# Patient Record
Sex: Male | Born: 1975 | Race: White | Hispanic: No | Marital: Single | Smoking: Former smoker
Health system: Southern US, Community
[De-identification: ages and names within clinical notes are randomized; demographics above are authoritative.]

## PROBLEM LIST (undated history)

## (undated) DIAGNOSIS — E0590 Thyrotoxicosis, unspecified without thyrotoxic crisis or storm: Secondary | ICD-10-CM

## (undated) DIAGNOSIS — K219 Gastro-esophageal reflux disease without esophagitis: Secondary | ICD-10-CM

## (undated) HISTORY — PX: BACK SURGERY: SHX140

## (undated) HISTORY — PX: KNEE SURGERY: SHX244

---

## 2016-05-15 ENCOUNTER — Emergency Department
Admission: EM | Admit: 2016-05-15 | Discharge: 2016-05-15 | Disposition: A | Discharge status: Home / Self Care | Payer: Self-pay | Attending: Emergency Medicine | Admitting: Emergency Medicine

## 2016-05-15 ENCOUNTER — Emergency Department: Payer: Self-pay

## 2016-05-15 ENCOUNTER — Encounter: Payer: Self-pay | Admitting: Emergency Medicine

## 2016-05-15 DIAGNOSIS — M25552 Pain in left hip: Secondary | ICD-10-CM | POA: Insufficient documentation

## 2016-05-15 DIAGNOSIS — W51XXXD Accidental striking against or bumped into by another person, subsequent encounter: Secondary | ICD-10-CM | POA: Insufficient documentation

## 2016-05-15 DIAGNOSIS — M5410 Radiculopathy, site unspecified: Secondary | ICD-10-CM | POA: Insufficient documentation

## 2016-05-15 DIAGNOSIS — G8911 Acute pain due to trauma: Secondary | ICD-10-CM

## 2016-05-15 DIAGNOSIS — Z87891 Personal history of nicotine dependence: Secondary | ICD-10-CM | POA: Insufficient documentation

## 2016-05-15 DIAGNOSIS — M545 Low back pain: Secondary | ICD-10-CM

## 2016-05-15 HISTORY — DX: Gastro-esophageal reflux disease without esophagitis: K21.9

## 2016-05-15 MED ORDER — OXYCODONE-ACETAMINOPHEN 5-325 MG PO TABS
2.0000 | ORAL_TABLET | Freq: Once | ORAL | Status: AC
  Administered 2016-05-15: 2 via ORAL
  Filled 2016-05-15: qty 2

## 2016-05-15 MED ORDER — DIAZEPAM 2 MG PO TABS: 2.0000 mg | ORAL_TABLET | Freq: Three times a day (TID) | ORAL | 0 refills | Status: AC | PRN

## 2016-05-15 MED ORDER — DICLOFENAC SODIUM 75 MG PO TBEC: 75.0000 mg | DELAYED_RELEASE_TABLET | Freq: Two times a day (BID) | ORAL | 0 refills | Status: AC

## 2016-05-15 NOTE — ED Provider Notes (Signed)
[facility] Emergency Department Provider Note  ____________________________________________   First MD Initiated Contact with Patient 05/15/16 1722     (approximate)  I have reviewed the triage vital signs and the nursing notes.   HISTORY  Chief Complaint Back Pain    HPI Juan Holland is a 41 y.o. male is here with complaint of back pain. Patient states that 2 weeks ago he had an injury at work and which time he was bumped by a another employee causing him to fall backwards. Patient states that he initially landed on his buttocks and then onto his back. He denies any head injury or loss of consciousness. He states on that day he was seen at [facility] and they had a sprain and sciatica. Patient states that he has not had any relief with the medication that he was given. He was discharged from the urgent care on baclofen and naproxen which she states did not help with his pain. He now states that back pain continues and has radiation into his left leg. Currently he is waiting for referral to the orthopedist. Patient denies any hematuria. He denies any incontinence of bowel or bladder. Patient states that he did have back surgery in 2002 when he had a fusion. Currently he rates his pain as an 8 out of 10.   Past Medical History:  Diagnosis Date  . GERD (gastroesophageal reflux disease)     There are no active problems to display for this patient.   Past Surgical History:  Procedure Laterality Date  . BACK SURGERY    . KNEE SURGERY      Prior to Admission medications   Medication Sig Start Date End Date Taking? Authorizing Provider  diazepam (VALIUM) 2 MG tablet Take 1 tablet (2 mg total) by mouth every 8 (eight) hours as needed for muscle spasms. 05/15/16   Tommi Rumps, PA-C  diclofenac (VOLTAREN) 75 MG EC tablet Take 1 tablet (75 mg total) by mouth 2 (two) times daily. 05/15/16   Tommi Rumps, PA-C    Allergies Amoxicillin;  Medrol [methylprednisolone]; and Toradol [ketorolac tromethamine]  History reviewed. No pertinent family history.  Social History Social History  Substance Use Topics  . Smoking status: Former Smoker    Quit date: 04/24/2008  . Smokeless tobacco: Never Used  . Alcohol use Yes     Comment: sometimes    Review of Systems Constitutional: No fever/chills Eyes: No visual changes. Cardiovascular: Denies chest pain. Respiratory: Denies shortness of breath. Gastrointestinal: No nausea, no vomiting.   Genitourinary: Negative for dysuria. Musculoskeletal: Positive for back pain. Positive for left hip pain Skin: Negative for rash. Neurological: Negative for headaches, focal weakness or numbness.  10-point ROS otherwise negative.  ____________________________________________   PHYSICAL EXAM:  VITAL SIGNS: ED Triage Vitals  Enc Vitals Group     BP 05/15/16 1657 (!) 145/86     Pulse Rate 05/15/16 1657 76     Resp 05/15/16 1657 18     Temp 05/15/16 1657 97.9 F (36.6 C)     Temp Source 05/15/16 1657 Oral     SpO2 05/15/16 1657 98 %     Weight 05/15/16 1658 232 lb (105.2 kg)     Height 05/15/16 1658 6\' 2"  (1.88 m)     Head Circumference --      Peak Flow --      Pain Score 05/15/16 1708 8     Pain Loc --  Pain Edu? --      Excl. in GC? --     Constitutional: Alert and oriented. Well appearing and in no acute distress. Eyes: Conjunctivae are normal. PERRL. EOMI. Head: Atraumatic. Nose: No congestion/rhinnorhea. Neck: No stridor.   Cardiovascular: Normal rate, regular rhythm. Grossly normal heart sounds.  Good peripheral circulation. Respiratory: Normal respiratory effort.  No retractions. Lungs CTAB. Gastrointestinal: Soft and nontender. No distention.  Musculoskeletal: On examination of the back there is no gross deformity. There is well-healed surgical scar at the waistline area. There is marked tenderness on palpation of the upper lumbar spine and continues to the  sacrum. There is tenderness on palpation of the paravertebral muscles. Range of motion is restricted and guarded. Neurologic:  Normal speech and language. No gross focal neurologic deficits are appreciated. Reflexes were 2+ bilaterally.  Skin:  Skin is warm, dry and intact. No rash noted. Psychiatric: Mood and affect are normal. Speech and behavior are normal.  ____________________________________________   LABS (all labs ordered are listed, but only abnormal results are displayed)  Labs Reviewed - No data to display  RADIOLOGY  Lumbar spine x-ray per radiologist: IMPRESSION: Slightly rotated exam without fracture noted. Minimal L3-4 and L5-S1 disc space narrowing. Mild L4-5 disc space narrowing.  Left hip x-ray per radiologist is negative for bony abnormalities.  I, Tommi Rumpshonda L Benuel Ly, personally viewed and evaluated these images (plain radiographs) as part of my medical decision making, as well as reviewing the written report by the radiologist. ____________________________________________   PROCEDURES  Procedure(s) performed: None  Procedures  Critical Care performed: No  ____________________________________________   INITIAL IMPRESSION / ASSESSMENT AND PLAN / ED COURSE  Pertinent labs & imaging results that were available during my care of the patient were reviewed by me and considered in my medical decision making (see chart for details).  Patient is allergic to Toradol and Medrol. Patient states that he's been taking naproxen and baclofen without any relief of his pain. Patient was also given some type of cortisone shot while in the clinic however he is unable to say what it is that he had a shot of. Patient was given a prescription for Voltaren 75 mg twice a day with food. He is also given a prescription for diazepam 2 mg 1 at bedtime. He is to discontinue taking the naproxen and baclofen if he has any left. He is to check with his company before making an appointment  with Dr. Joice LoftsPoggi. He is encouraged to use ice or heat to his back. He is aware that he cannot take the muscle relaxant at work because it could cause drowsiness.   ____________________________________________   FINAL CLINICAL IMPRESSION(S) / ED DIAGNOSES  Final diagnoses:  Acute low back pain due to trauma  Radicular syndrome of left leg      NEW MEDICATIONS STARTED DURING THIS VISIT:  Discharge Medication List as of 05/15/2016  7:11 PM    START taking these medications   Details  diazepam (VALIUM) 2 MG tablet Take 1 tablet (2 mg total) by mouth every 8 (eight) hours as needed for muscle spasms., Starting Mon 05/15/2016, Print    diclofenac (VOLTAREN) 75 MG EC tablet Take 1 tablet (75 mg total) by mouth 2 (two) times daily., Starting Mon 05/15/2016, Print         Note:  This document was prepared using Dragon voice recognition software and may include unintentional dictation errors.    Tommi RumpsRhonda L Laramie Gelles, PA-C 05/15/16 1926    Myra Rudeavid Matthew  Schaevitz, MD 05/15/16 2224

## 2016-05-15 NOTE — ED Notes (Signed)

## 2016-05-15 NOTE — ED Triage Notes (Signed)
Pt from home with back and leg pain following a fall 2 weeks ago. He states that he went to urgent care and was told he had a sprain and sciatica. Pt state the pain is like burning, all in his back, groin, down leg. Waiting for referral to orthopedist. Pt alert & oriented.

## 2016-05-15 NOTE — Discharge Instructions (Signed)
Follow-up with Dr. Joice LoftsPoggi, you need to call and make an appointment. He is the orthopedist that is on call today. Check with your accompanying before making this appointment since this is Workmen's Comp. Take medication only as directed. Voltaren 75 mg twice a day with food. Diazepam 2 mg 1 tablet at bed time for muscle spasms. Discontinue taking naproxen and baclofen.

## 2018-04-14 IMAGING — CR DG HIP (WITH OR WITHOUT PELVIS) 2-3V*L*
1 series · 3 of 3 positions shown · non-contrast
Comparison: None.

CLINICAL DATA: 40-year-old male with a history of back pain and leg
pain after a fall

EXAM:
DG HIP (WITH OR WITHOUT PELVIS) 2-3V LEFT

[Series 1: dg hip unilat w or w/o pelvis 2-3 views  · non-contrast · 0.14mm/px · 3 of 3 slices shown]
[im 1/3]
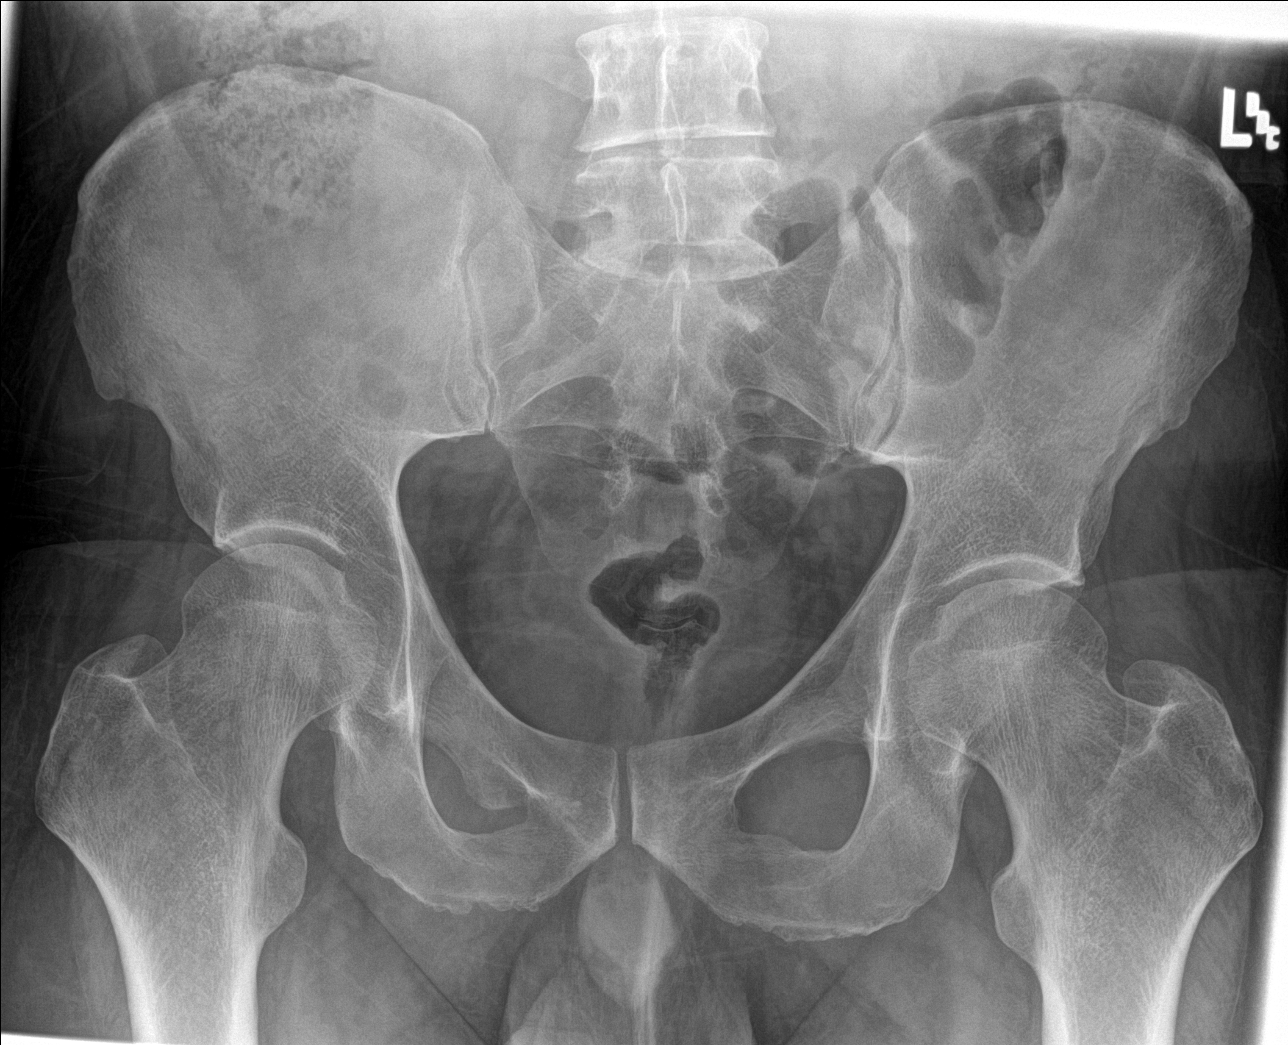
[im 2/3]
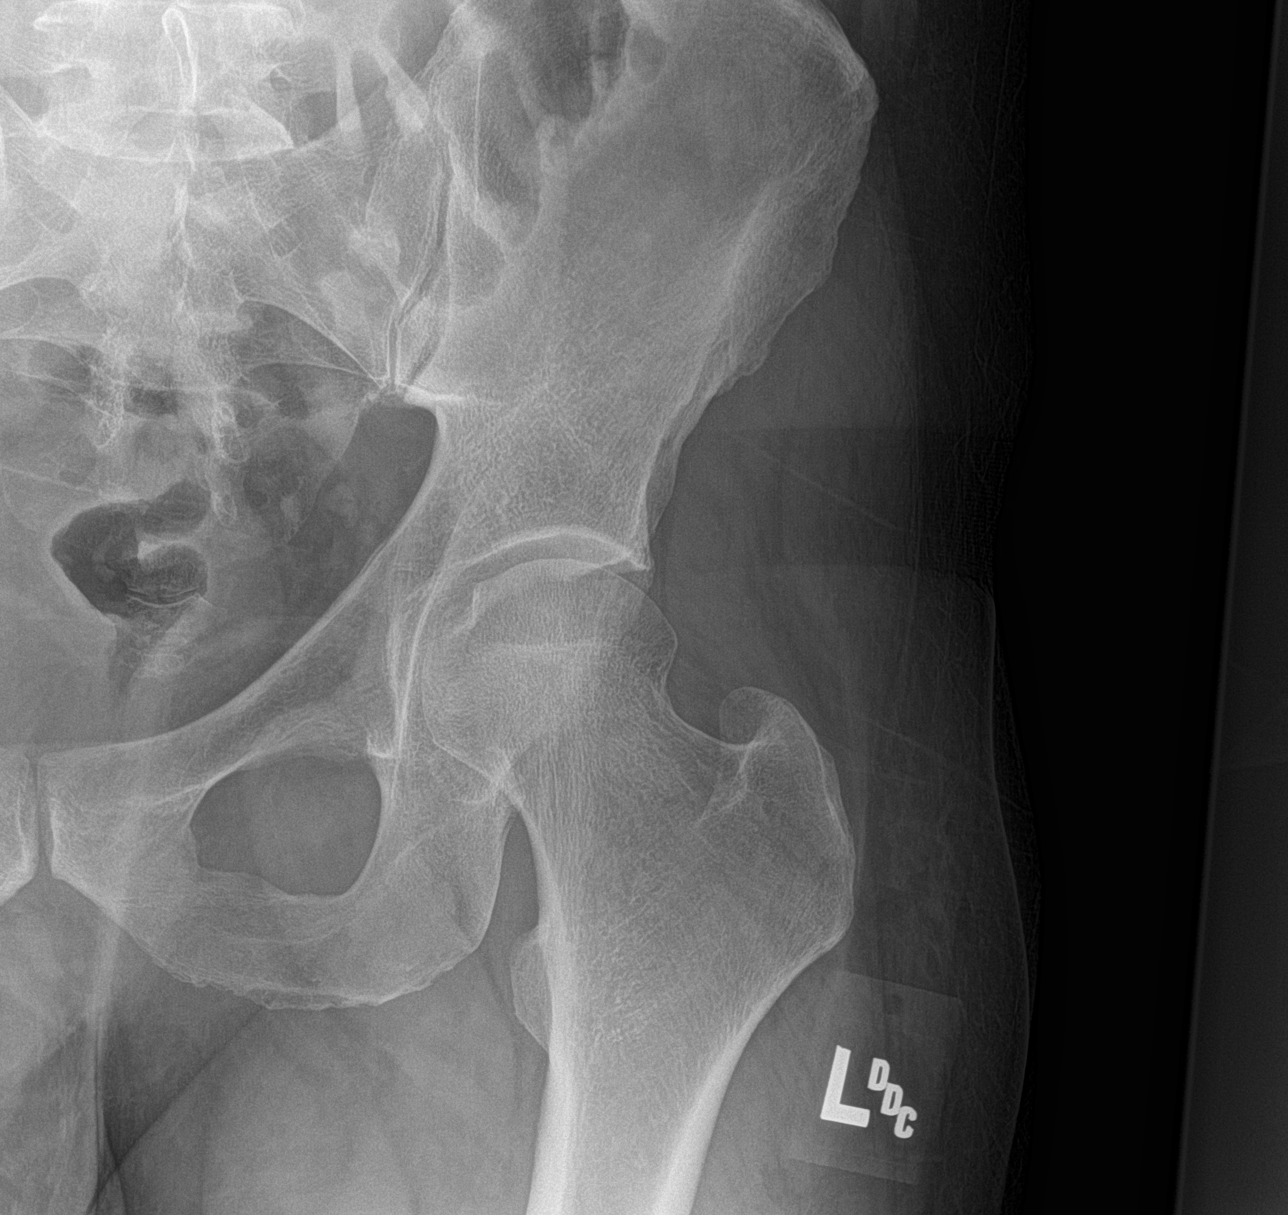
[im 3/3]
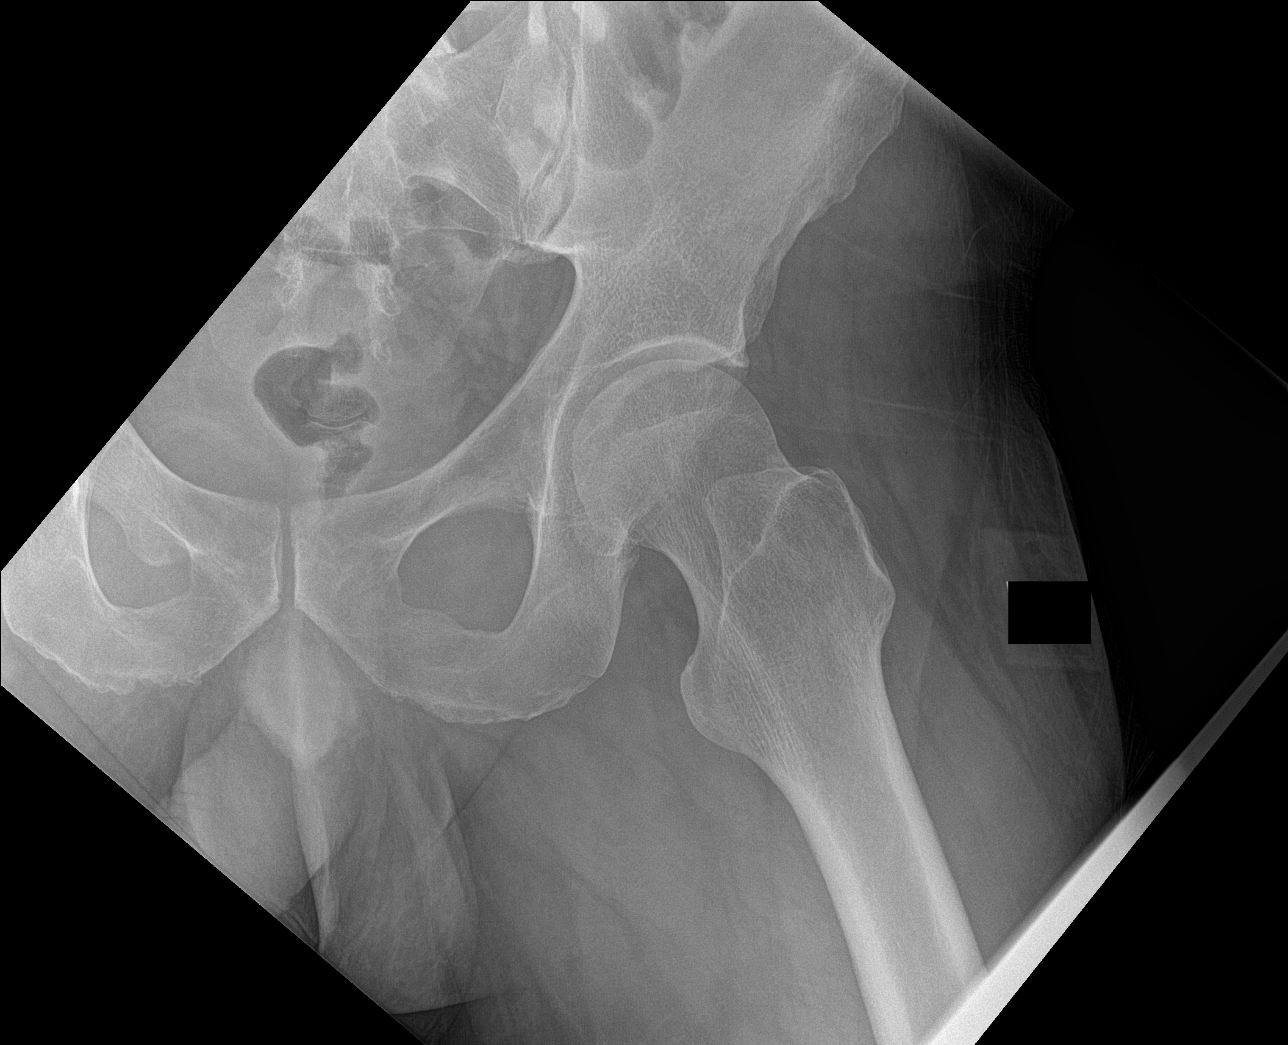

[3 of 3 positions shown; findings below may reference images not displayed]

FINDINGS: There is no evidence of hip fracture or dislocation. There is no
evidence of arthropathy or other focal bone abnormality.
IMPRESSION: Negative for acute bony abnormality.

## 2022-12-18 ENCOUNTER — Other Ambulatory Visit: Payer: Self-pay

## 2022-12-18 ENCOUNTER — Emergency Department: Payer: Medicaid Other

## 2022-12-18 ENCOUNTER — Emergency Department
Admission: EM | Admit: 2022-12-18 | Discharge: 2022-12-18 | Disposition: A | Discharge status: Home / Self Care | Payer: Medicaid Other | Attending: Emergency Medicine | Admitting: Emergency Medicine

## 2022-12-18 DIAGNOSIS — R0789 Other chest pain: Secondary | ICD-10-CM | POA: Insufficient documentation

## 2022-12-18 LAB — BASIC METABOLIC PANEL
Anion gap: 8 (ref 5–15)
BUN: 11 mg/dL (ref 6–20)
CO2: 23 mmol/L (ref 22–32)
Calcium: 8.9 mg/dL (ref 8.9–10.3)
Chloride: 106 mmol/L (ref 98–111)
Creatinine, Ser: 0.9 mg/dL (ref 0.61–1.24)
GFR, Estimated: >60 mL/min (ref >60)
Glucose, Bld: 112 mg/dL — ABNORMAL HIGH (ref 70–99)
Potassium: 3.7 mmol/L (ref 3.5–5.1)
Sodium: 137 mmol/L (ref 135–145)

## 2022-12-18 LAB — CBC
HCT: 45.2 % (ref 39.0–52.0)
Hemoglobin: 15.9 g/dL (ref 13.0–17.0)
MCH: 30.1 pg (ref 26.0–34.0)
MCHC: 35.2 g/dL (ref 30.0–36.0)
MCV: 85.4 fL (ref 80.0–100.0)
Platelets: 234 10*3/uL (ref 150–400)
RBC: 5.29 MIL/uL (ref 4.22–5.81)
RDW: 12.6 % (ref 11.5–15.5)
WBC: 9 10*3/uL (ref 4.0–10.5)
nRBC: 0 % (ref 0.0–0.2)

## 2022-12-18 LAB — TROPONIN I (HIGH SENSITIVITY): Troponin I (High Sensitivity): 6 ng/L (ref <18)

## 2022-12-18 MED ORDER — IBUPROFEN 600 MG PO TABS: 600.0000 mg | ORAL_TABLET | Freq: Four times a day (QID) | ORAL | 0 refills | Status: AC | PRN

## 2022-12-18 MED ORDER — CYCLOBENZAPRINE HCL 10 MG PO TABS
10.0000 mg | ORAL_TABLET | Freq: Once | ORAL | Status: AC
  Administered 2022-12-18: 10 mg via ORAL
  Filled 2022-12-18: qty 1

## 2022-12-18 MED ORDER — CYCLOBENZAPRINE HCL 10 MG PO TABS: 10.0000 mg | ORAL_TABLET | Freq: Three times a day (TID) | ORAL | 0 refills | Status: AC | PRN

## 2022-12-18 MED ORDER — IBUPROFEN 600 MG PO TABS
600.0000 mg | ORAL_TABLET | Freq: Once | ORAL | Status: AC
  Administered 2022-12-18: 600 mg via ORAL
  Filled 2022-12-18: qty 1

## 2022-12-18 NOTE — Discharge Instructions (Signed)
Take the ibuprofen and Flexeril as needed over the next several days.  Warm compresses can also help.  Follow-up with a primary care provider.  Return to the ER for new, worsening, or persistent severe chest pain, difficulty breathing, palpitations, weakness or lightheadedness, or any other new or worsening symptoms that concern you.

## 2022-12-18 NOTE — ED Triage Notes (Signed)
Pt to ED for chest pain and dizziness started this morning. States has been moving a lot of furniture and unsure if related, increased stress.

## 2022-12-18 NOTE — ED Provider Notes (Signed)
[facility] Provider Note    Event Date/Time   First MD Initiated Contact with Patient 12/18/22 2043     (approximate)   History   Chest Pain   HPI  Juan Holland is a 47 y.o. male with a history of GERD, remote history of "mild" MI, and remote history of DVT, not on anticoagulation, who presents with chest pain since this morning after the patient had been lifting heavy items during a move.  The pain is worse with touching his chest wall, or with certain movements of his upper body.  It is sharp and intermittent.  Sometimes it will hit him like a wave and then subside after a few minutes.  He denies any associated shortness of breath, palpitations, cough, fever, or lightheadedness.  He has no leg swelling.  He denies any specific trauma or injury to the chest wall.  I reviewed the past medical records.  The patient has no recent records available here as he just moved back to the area from [facility].  His most recent documented encounter is at the [facility] ED in 2019 for suicidal ideation.   Physical Exam   Triage Vital Signs: ED Triage Vitals [12/18/22 1854]  Encounter Vitals Group     BP (!) 153/110     Systolic BP Percentile      Diastolic BP Percentile      Pulse Rate 83     Resp 20     Temp 98.6 F (37 C)     Temp src      SpO2 96 %     Weight 260 lb (117.9 kg)     Height 6\' 3"  (1.905 m)     Head Circumference      Peak Flow      Pain Score 8     Pain Loc      Pain Education      Exclude from Growth Chart     Most recent vital signs: Vitals:   12/18/22 1854  BP: (!) 153/110  Pulse: 83  Resp: 20  Temp: 98.6 F (37 C)  SpO2: 96%    General: Alert, well-appearing, no distress.  CV:  Good peripheral perfusion.  Normal heart sounds. Resp:  Normal effort.  Lungs CTAB. Abd:  No distention.  Other:  Left chest wall with mild tenderness, reproducing the pain.  No calf or popliteal swelling or tenderness.   ED Results / Procedures /  Treatments   Labs (all labs ordered are listed, but only abnormal results are displayed) Labs Reviewed  BASIC METABOLIC PANEL - Abnormal; Notable for the following components:      Result Value   Glucose, Bld 112 (*)    All other components within normal limits  CBC  TROPONIN I (HIGH SENSITIVITY)     EKG  ED ECG REPORT I, Dionne Bucy, the attending physician, personally viewed and interpreted this ECG.  Date: 12/18/2022 EKG Time: 1900 Rate: 79 Rhythm: normal sinus rhythm QRS Axis: normal Intervals: normal ST/T Wave abnormalities: normal Narrative Interpretation: no evidence of acute ischemia    RADIOLOGY  Chest x-ray: I independently viewed and interpreted the images; there is no focal consolidation or edema  PROCEDURES:  Critical Care performed: No  Procedures   MEDICATIONS ORDERED IN ED: Medications  ibuprofen (ADVIL) tablet 600 mg (600 mg Oral Given 12/18/22 2125)  cyclobenzaprine (FLEXERIL) tablet 10 mg (10 mg Oral Given 12/18/22 2126)     IMPRESSION / MDM / ASSESSMENT AND  PLAN / ED COURSE  I reviewed the triage vital signs and the nursing notes.  47 year old male with PMH as noted above presents with atypical, reproducible left-sided chest pain after moving heavy items earlier today.  The pain has been around since the morning.  It is now somewhat subsided.  On exam he is hypertensive with otherwise normal vital signs and overall well-appearing.  He has reproducible left chest wall tenderness with no other concerning exam findings.  EKG is nonischemic.  Differential diagnosis includes, but is not limited to, muscle strain or spasm, costochondritis, other musculoskeletal chest wall pain.  There is no evidence of cardiac etiology.  EKG shows no findings.  Troponin is negative.  Given the duration of the symptoms there is no indication for a repeat.  There is also no clinical evidence for a PE.  The patient has no tachycardia, hypoxia, or other concerning  findings.  I did discuss with him the possibility of obtaining a D-dimer given his remote prior history of DVT, however the patient declines.  My clinical suspicion for PE is extremely low.  There is no evidence of aortic dissection or other vascular cause.  Patient's presentation is most consistent with acute complicated illness / injury requiring diagnostic workup.  At this time, the patient is stable for discharge home.  I will treat with ibuprofen and Flexeril for likely muscle strain or spasm.  I counseled him on the results of the workup and plan of care.  He feels well and is comfortable going home.  I gave strict return precautions and he expressed understanding.  FINAL CLINICAL IMPRESSION(S) / ED DIAGNOSES   Final diagnoses:  Chest wall pain     Rx / DC Orders   ED Discharge Orders          Ordered    ibuprofen (ADVIL) 600 MG tablet  Every 6 hours PRN        12/18/22 2124    cyclobenzaprine (FLEXERIL) 10 MG tablet  3 times daily PRN        12/18/22 2124             Note:  This document was prepared using Dragon voice recognition software and may include unintentional dictation errors.    Dionne Bucy, MD 12/18/22 2257

## 2022-12-28 ENCOUNTER — Other Ambulatory Visit: Payer: Self-pay

## 2022-12-28 ENCOUNTER — Encounter: Payer: Self-pay | Admitting: Emergency Medicine

## 2022-12-28 DIAGNOSIS — G4700 Insomnia, unspecified: Secondary | ICD-10-CM | POA: Insufficient documentation

## 2022-12-28 DIAGNOSIS — Z5321 Procedure and treatment not carried out due to patient leaving prior to being seen by health care provider: Secondary | ICD-10-CM | POA: Insufficient documentation

## 2022-12-28 DIAGNOSIS — F419 Anxiety disorder, unspecified: Secondary | ICD-10-CM | POA: Diagnosis present

## 2022-12-28 NOTE — ED Triage Notes (Signed)
Pt presents ambulatory to triage via POV with complaints of anxiety and insomnia. He notes napping this AM for a few hours but has only had a few hours sleep over the last 3 days. A&Ox4 at this time. Of note, the patient needs a refill on his home meds - stating he hasn't seen a Dr since moving here from [facility]. Denies SI/HI, CP or SOB.

## 2022-12-29 ENCOUNTER — Emergency Department
Admission: EM | Admit: 2022-12-29 | Discharge: 2022-12-29 | Discharge status: Against Medical Advice | Payer: Medicaid Other | Attending: Emergency Medicine | Admitting: Emergency Medicine

## 2022-12-29 HISTORY — DX: Thyrotoxicosis, unspecified without thyrotoxic crisis or storm: E05.90

## 2022-12-29 NOTE — ED Notes (Signed)
No answer when called several times from lobby 

## 2023-01-01 NOTE — Group Note (Deleted)

## 2023-01-22 ENCOUNTER — Encounter: Payer: Self-pay | Admitting: Emergency Medicine

## 2023-01-22 ENCOUNTER — Other Ambulatory Visit: Payer: Self-pay

## 2023-01-22 DIAGNOSIS — M48061 Spinal stenosis, lumbar region without neurogenic claudication: Secondary | ICD-10-CM | POA: Diagnosis not present

## 2023-01-22 DIAGNOSIS — W11XXXA Fall on and from ladder, initial encounter: Secondary | ICD-10-CM | POA: Insufficient documentation

## 2023-01-22 DIAGNOSIS — Z5321 Procedure and treatment not carried out due to patient leaving prior to being seen by health care provider: Secondary | ICD-10-CM | POA: Insufficient documentation

## 2023-01-22 DIAGNOSIS — M5136 Other intervertebral disc degeneration, lumbar region with discogenic back pain only: Secondary | ICD-10-CM | POA: Diagnosis not present

## 2023-01-22 DIAGNOSIS — S3992XA Unspecified injury of lower back, initial encounter: Secondary | ICD-10-CM | POA: Diagnosis present

## 2023-01-22 DIAGNOSIS — Z043 Encounter for examination and observation following other accident: Secondary | ICD-10-CM | POA: Diagnosis not present

## 2023-01-22 NOTE — ED Triage Notes (Signed)
Patient ambulatory to triage with steady gait, without difficulty or distress noted; pt reports today coming down ladder, missed last step, falling & injuring lower back

## 2023-01-23 ENCOUNTER — Emergency Department: Payer: Medicaid Other

## 2023-01-23 ENCOUNTER — Emergency Department
Admission: EM | Admit: 2023-01-23 | Discharge: 2023-01-23 | Discharge status: Against Medical Advice | Payer: Medicaid Other | Attending: Emergency Medicine | Admitting: Emergency Medicine

## 2023-01-23 DIAGNOSIS — Z043 Encounter for examination and observation following other accident: Secondary | ICD-10-CM | POA: Diagnosis not present

## 2023-01-23 DIAGNOSIS — M5136 Other intervertebral disc degeneration, lumbar region with discogenic back pain only: Secondary | ICD-10-CM | POA: Diagnosis not present

## 2023-01-23 DIAGNOSIS — M48061 Spinal stenosis, lumbar region without neurogenic claudication: Secondary | ICD-10-CM | POA: Diagnosis not present

## 2023-01-23 NOTE — ED Notes (Signed)
No answer when called several times from lobby 

## 2023-02-01 DIAGNOSIS — R531 Weakness: Secondary | ICD-10-CM | POA: Diagnosis not present

## 2023-02-01 DIAGNOSIS — S40012A Contusion of left shoulder, initial encounter: Secondary | ICD-10-CM | POA: Diagnosis not present

## 2023-02-01 DIAGNOSIS — S91052A Open bite, left ankle, initial encounter: Secondary | ICD-10-CM | POA: Diagnosis not present

## 2023-02-01 DIAGNOSIS — W540XXA Bitten by dog, initial encounter: Secondary | ICD-10-CM | POA: Diagnosis not present

## 2023-02-01 DIAGNOSIS — S8001XA Contusion of right knee, initial encounter: Secondary | ICD-10-CM | POA: Diagnosis not present

## 2023-02-01 DIAGNOSIS — Z23 Encounter for immunization: Secondary | ICD-10-CM | POA: Diagnosis not present

## 2023-02-01 DIAGNOSIS — Z203 Contact with and (suspected) exposure to rabies: Secondary | ICD-10-CM | POA: Diagnosis not present

## 2023-02-01 DIAGNOSIS — Z2914 Encounter for prophylactic rabies immune globin: Secondary | ICD-10-CM | POA: Diagnosis not present

## 2023-02-01 DIAGNOSIS — S5012XA Contusion of left forearm, initial encounter: Secondary | ICD-10-CM | POA: Diagnosis not present

## 2023-02-01 DIAGNOSIS — S91032A Puncture wound without foreign body, left ankle, initial encounter: Secondary | ICD-10-CM | POA: Diagnosis not present

## 2023-02-01 DIAGNOSIS — S8002XA Contusion of left knee, initial encounter: Secondary | ICD-10-CM | POA: Diagnosis not present

## 2023-05-01 DIAGNOSIS — W208XXA Other cause of strike by thrown, projected or falling object, initial encounter: Secondary | ICD-10-CM | POA: Diagnosis not present

## 2023-05-01 DIAGNOSIS — S9031XA Contusion of right foot, initial encounter: Secondary | ICD-10-CM | POA: Diagnosis not present

## 2023-05-01 DIAGNOSIS — K219 Gastro-esophageal reflux disease without esophagitis: Secondary | ICD-10-CM | POA: Diagnosis not present

## 2023-05-01 DIAGNOSIS — W1830XA Fall on same level, unspecified, initial encounter: Secondary | ICD-10-CM | POA: Diagnosis not present

## 2023-05-01 DIAGNOSIS — S92401A Displaced unspecified fracture of right great toe, initial encounter for closed fracture: Secondary | ICD-10-CM | POA: Diagnosis not present

## 2023-05-01 DIAGNOSIS — S90111A Contusion of right great toe without damage to nail, initial encounter: Secondary | ICD-10-CM | POA: Diagnosis not present

## 2023-05-21 DIAGNOSIS — S93401A Sprain of unspecified ligament of right ankle, initial encounter: Secondary | ICD-10-CM | POA: Diagnosis not present

## 2023-05-21 DIAGNOSIS — W230XXA Caught, crushed, jammed, or pinched between moving objects, initial encounter: Secondary | ICD-10-CM | POA: Diagnosis not present

## 2023-05-25 DIAGNOSIS — M79604 Pain in right leg: Secondary | ICD-10-CM | POA: Diagnosis not present

## 2023-05-25 DIAGNOSIS — R2241 Localized swelling, mass and lump, right lower limb: Secondary | ICD-10-CM | POA: Diagnosis not present
